# Patient Record
Sex: Female | Born: 2000
Health system: Southern US, Community
[De-identification: ages and names within clinical notes are randomized; demographics above are authoritative.]

## PROBLEM LIST (undated history)

## (undated) DIAGNOSIS — J302 Other seasonal allergic rhinitis: Secondary | ICD-10-CM

---
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
## 2020-01-27 ENCOUNTER — Emergency Department (HOSPITAL_COMMUNITY): Payer: BC Managed Care – PPO

## 2020-01-27 ENCOUNTER — Emergency Department (HOSPITAL_COMMUNITY)
Admission: EM | Admit: 2020-01-27 | Discharge: 2020-01-27 | Disposition: A | Discharge status: Home / Self Care | Payer: BC Managed Care – PPO | Attending: Emergency Medicine | Admitting: Emergency Medicine

## 2020-01-27 ENCOUNTER — Encounter (HOSPITAL_COMMUNITY): Payer: Self-pay

## 2020-01-27 ENCOUNTER — Other Ambulatory Visit: Payer: Self-pay

## 2020-01-27 DIAGNOSIS — R079 Chest pain, unspecified: Secondary | ICD-10-CM | POA: Diagnosis present

## 2020-01-27 DIAGNOSIS — U071 COVID-19: Secondary | ICD-10-CM | POA: Diagnosis not present

## 2020-01-27 LAB — CBC WITH DIFFERENTIAL/PLATELET
Abs Immature Granulocytes: 0.01 10*3/uL (ref 0.00–0.07)
Basophils Absolute: 0 10*3/uL (ref 0.0–0.1)
Basophils Relative: 1 %
Eosinophils Absolute: 0 10*3/uL (ref 0.0–0.5)
Eosinophils Relative: 1 %
HCT: 43 % (ref 36.0–46.0)
Hemoglobin: 13.8 g/dL (ref 12.0–15.0)
Immature Granulocytes: 0 %
Lymphocytes Relative: 39 %
Lymphs Abs: 1.6 10*3/uL (ref 0.7–4.0)
MCH: 30.7 pg (ref 26.0–34.0)
MCHC: 32.1 g/dL (ref 30.0–36.0)
MCV: 95.8 fL (ref 80.0–100.0)
Monocytes Absolute: 0.5 10*3/uL (ref 0.1–1.0)
Monocytes Relative: 11 %
Neutro Abs: 2 10*3/uL (ref 1.7–7.7)
Neutrophils Relative %: 48 %
Platelets: 240 10*3/uL (ref 150–400)
RBC: 4.49 MIL/uL (ref 3.87–5.11)
RDW: 12.5 % (ref 11.5–15.5)
WBC Morphology: ABNORMAL
WBC: 4.1 10*3/uL (ref 4.0–10.5)
nRBC: 0 % (ref 0.0–0.2)

## 2020-01-27 LAB — BASIC METABOLIC PANEL
Anion gap: 8 (ref 5–15)
BUN: 12 mg/dL (ref 6–20)
CO2: 26 mmol/L (ref 22–32)
Calcium: 9 mg/dL (ref 8.9–10.3)
Chloride: 103 mmol/L (ref 98–111)
Creatinine, Ser: 0.62 mg/dL (ref 0.44–1.00)
GFR calc Af Amer: >60 mL/min (ref >60)
GFR calc non Af Amer: >60 mL/min (ref >60)
Glucose, Bld: 108 mg/dL — ABNORMAL HIGH (ref 70–99)
Potassium: 3.6 mmol/L (ref 3.5–5.1)
Sodium: 137 mmol/L (ref 135–145)

## 2020-01-27 LAB — TROPONIN I (HIGH SENSITIVITY): Troponin I (High Sensitivity): <2 ng/L (ref <18)

## 2020-01-27 MED ORDER — ALBUTEROL SULFATE HFA 108 (90 BASE) MCG/ACT IN AERS
2.0000 | INHALATION_SPRAY | Freq: Once | RESPIRATORY_TRACT | Status: AC
  Administered 2020-01-27: 2 via RESPIRATORY_TRACT
  Filled 2020-01-27: qty 6.7

## 2020-01-27 MED ORDER — AEROCHAMBER Z-STAT PLUS/MEDIUM MISC
1.0000 | Freq: Once | Status: AC
  Administered 2020-01-27: 1
  Filled 2020-01-27: qty 1

## 2020-01-27 NOTE — ED Triage Notes (Signed)
Pt presents to ED with complaints of mid chest pain started today. Pt diagnosed with Covid on Monday.

## 2020-01-27 NOTE — Discharge Instructions (Addendum)
Continue your home isolation for a total of 10 days, rest and make sure you are drinking plenty of fluids. Ibuprofen (advil or motrin) can help with body aches and any fever if this develops.  You may use the inhaler given - 2 puffs every 4 hours if you continue to feel tight in your chest or you develop wheezing or shortness of breath.       Person Under Monitoring Name: Cynthia Joyce  Location: [address] [place] [address]   Infection Prevention Recommendations for Individuals Confirmed to have, or Being Evaluated for, 2019 Novel Coronavirus (COVID-19) Infection Who Receive Care at Home  Individuals who are confirmed to have, or are being evaluated for, COVID-19 should follow the prevention steps below until a healthcare provider or local or state health department says they can return to normal activities.  Stay home except to get medical care You should restrict activities outside your home, except for getting medical care. Do not go to work, school, or public areas, and do not use public transportation or taxis.  Call ahead before visiting your doctor Before your medical appointment, call the healthcare provider and tell them that you have, or are being evaluated for, COVID-19 infection. This will help the healthcare provider's office take steps to keep other people from getting infected. Ask your healthcare provider to call the local or state health department.  Monitor your symptoms Seek prompt medical attention if your illness is worsening (e.g., difficulty breathing). Before going to your medical appointment, call the healthcare provider and tell them that you have, or are being evaluated for, COVID-19 infection. Ask your healthcare provider to call the local or state health department.  Wear a facemask You should wear a facemask that covers your nose and mouth when you are in the same room with other people and when you visit a healthcare provider. People  who live with or visit you should also wear a facemask while they are in the same room with you.  Separate yourself from other people in your home As much as possible, you should stay in a different room from other people in your home. Also, you should use a separate bathroom, if available.  Avoid sharing household items You should not share dishes, drinking glasses, cups, eating utensils, towels, bedding, or other items with other people in your home. After using these items, you should wash them thoroughly with soap and water.  Cover your coughs and sneezes Cover your mouth and nose with a tissue when you cough or sneeze, or you can cough or sneeze into your sleeve. Throw used tissues in a lined trash can, and immediately wash your hands with soap and water for at least 20 seconds or use an alcohol-based hand rub.  Wash your [employer] your hands often and thoroughly with soap and water for at least 20 seconds. You can use an alcohol-based hand sanitizer if soap and water are not available and if your hands are not visibly dirty. Avoid touching your eyes, nose, and mouth with unwashed hands.   Prevention Steps for Caregivers and Household Members of Individuals Confirmed to have, or Being Evaluated for, COVID-19 Infection Being Cared for in the Home  If you live with, or provide care at home for, a person confirmed to have, or being evaluated for, COVID-19 infection please follow these guidelines to prevent infection:  Follow healthcare provider's instructions Make sure that you understand and can help the patient follow any healthcare provider instructions for  all care.  Provide for the patient's basic needs You should help the patient with basic needs in the home and provide support for getting groceries, prescriptions, and other personal needs.  Monitor the patient's symptoms If they are getting sicker, call his or her medical provider and tell them that the patient has, or  is being evaluated for, COVID-19 infection. This will help the healthcare provider's office take steps to keep other people from getting infected. Ask the healthcare provider to call the local or state health department.  Limit the number of people who have contact with the patient If possible, have only one caregiver for the patient. Other household members should stay in another home or place of residence. If this is not possible, they should stay in another room, or be separated from the patient as much as possible. Use a separate bathroom, if available. Restrict visitors who do not have an essential need to be in the home.  Keep older adults, very young children, and other sick people away from the patient Keep older adults, very young children, and those who have compromised immune systems or chronic health conditions away from the patient. This includes people with chronic heart, lung, or kidney conditions, diabetes, and cancer.  Ensure good ventilation Make sure that shared spaces in the home have good air flow, such as from an air conditioner or an opened window, weather permitting.  Wash your hands often Wash your hands often and thoroughly with soap and water for at least 20 seconds. You can use an alcohol based hand sanitizer if soap and water are not available and if your hands are not visibly dirty. Avoid touching your eyes, nose, and mouth with unwashed hands. Use disposable paper towels to dry your hands. If not available, use dedicated cloth towels and replace them when they become wet.  Wear a facemask and gloves Wear a disposable facemask at all times in the room and gloves when you touch or have contact with the patient's blood, body fluids, and/or secretions or excretions, such as sweat, saliva, sputum, nasal mucus, vomit, urine, or feces.  Ensure the mask fits over your nose and mouth tightly, and do not touch it during use. Throw out disposable facemasks and gloves after  using them. Do not reuse. Wash your hands immediately after removing your facemask and gloves. If your personal clothing becomes contaminated, carefully remove clothing and launder. Wash your hands after handling contaminated clothing. Place all used disposable facemasks, gloves, and other waste in a lined container before disposing them with other household waste. Remove gloves and wash your hands immediately after handling these items.  Do not share dishes, glasses, or other household items with the patient Avoid sharing household items. You should not share dishes, drinking glasses, cups, eating utensils, towels, bedding, or other items with a patient who is confirmed to have, or being evaluated for, COVID-19 infection. After the person uses these items, you should wash them thoroughly with soap and water.  Wash laundry thoroughly Immediately remove and wash clothes or bedding that have blood, body fluids, and/or secretions or excretions, such as sweat, saliva, sputum, nasal mucus, vomit, urine, or feces, on them. Wear gloves when handling laundry from the patient. Read and follow directions on labels of laundry or clothing items and detergent. In general, wash and dry with the warmest temperatures recommended on the label.  Clean all areas the individual has used often Clean all touchable surfaces, such as counters, tabletops, doorknobs, bathroom fixtures, toilets,  phones, keyboards, tablets, and bedside tables, every day. Also, clean any surfaces that may have blood, body fluids, and/or secretions or excretions on them. Wear gloves when cleaning surfaces the patient has come in contact with. Use a diluted bleach solution (e.g., dilute bleach with 1 part bleach and 10 parts water) or a household disinfectant with a label that says EPA-registered for coronaviruses. To make a bleach solution at home, add 1 tablespoon of bleach to 1 quart (4 cups) of water. For a larger supply, add  cup of bleach  to 1 gallon (16 cups) of water. Read labels of cleaning products and follow recommendations provided on product labels. Labels contain instructions for safe and effective use of the cleaning product including precautions you should take when applying the product, such as wearing gloves or eye protection and making sure you have good ventilation during use of the product. Remove gloves and wash hands immediately after cleaning.  Monitor yourself for signs and symptoms of illness Caregivers and household members are considered close contacts, should monitor their health, and will be asked to limit movement outside of the home to the extent possible. Follow the monitoring steps for close contacts listed on the symptom monitoring form.   ? If you have additional questions, contact your local health department or call the epidemiologist on call at [phone] (available 24/7). ? This guidance is subject to change. For the most up-to-date guidance from [employer], please refer to their website: [email or web address]

## 2020-01-29 NOTE — ED Provider Notes (Signed)
[facility] EMERGENCY DEPARTMENT Provider Note   CSN: [id number] Arrival date & time: 01/27/20  1524     History Chief Complaint  Patient presents with  . Chest Pain  . Covid Positive    Cynthia Joyce is a 19 y.o. female with no significant past medical history presenting for evaluation of midsternal chest pressure which started today.  She tested positive for Covid 2 days ago.  She denies having any symptoms the day of her testing, but states her sister tested positive so she got screened as well.  She does report a nonproductive cough, endorses some fatigue, denies shortness of breath, also no fevers, no abdominal pain, no nausea or vomiting, no recognized wheezing, also denies palpitations, lightheadedness or diaphoresis.  The history is provided by the patient.       Past Medical History:  Diagnosis Date  . Seasonal allergies     There are no problems to display for this patient.   History reviewed. No pertinent surgical history.   OB History   No obstetric history on file.     No family history on file.  Social History   Tobacco Use  . Smoking status: Never Smoker  . Smokeless tobacco: Never Used  Substance Use Topics  . Alcohol use: Never  . Drug use: Never    Home Medications Prior to Admission medications   Medication Sig Start Date End Date Taking? Authorizing Provider  acetaminophen (TYLENOL) 325 MG tablet Take 650 mg by mouth every 6 (six) hours as needed.   Yes [provider]  guaiFENesin (MUCINEX) 600 MG 12 hr tablet Take 1,200 mg by mouth 2 (two) times daily.   Yes [provider]  ibuprofen (ADVIL) 200 MG tablet Take 400 mg by mouth every 6 (six) hours as needed for headache or moderate pain.   Yes [provider]  LO LOESTRIN FE 1 MG-10 MCG / 10 MCG tablet Take 1 tablet by mouth daily. 02/26/18  Yes [provider]  FLUoxetine (PROZAC) 10 MG tablet Take 1 tablet (10 mg total) by mouth daily. Patient not  taking: Reported on 01/27/2020 03/02/18   Gerhard Munch, MD    Allergies    Patient has no known allergies.  Review of Systems   Review of Systems  Constitutional: Positive for fatigue. Negative for chills and fever.  HENT: Negative for congestion, ear pain, rhinorrhea, sinus pressure, sore throat, trouble swallowing and voice change.   Eyes: Negative for discharge.  Respiratory: Positive for cough and chest tightness. Negative for shortness of breath, wheezing and stridor.   Cardiovascular: Negative for chest pain, palpitations and leg swelling.  Gastrointestinal: Negative for abdominal pain.  Genitourinary: Negative.   Musculoskeletal: Positive for myalgias.  All other systems reviewed and are negative.   Physical Exam Updated Vital Signs BP 119/89   Pulse 79   Temp 98.2 F (36.8 C) (Oral)   Resp 17   Ht 5\' 1"  (1.549 m)   Wt 46.3 kg   LMP 01/25/2020   SpO2 100%   BMI 19.27 kg/m   Physical Exam Vitals and nursing note reviewed.  Constitutional:      Appearance: She is well-developed.  HENT:     Head: Normocephalic and atraumatic.  Eyes:     Conjunctiva/sclera: Conjunctivae normal.  Cardiovascular:     Rate and Rhythm: Normal rate and regular rhythm.     Heart sounds: Normal heart sounds.  Pulmonary:     Effort: Pulmonary effort is normal.  Breath sounds: Normal breath sounds. No decreased breath sounds, wheezing, rhonchi or rales.  Abdominal:     General: Bowel sounds are normal.     Palpations: Abdomen is soft.     Tenderness: There is no abdominal tenderness.  Musculoskeletal:        General: Normal range of motion.     Cervical back: Normal range of motion.  Skin:    General: Skin is warm and dry.  Neurological:     Mental Status: She is alert.     ED Results / Procedures / Treatments   Labs (all labs ordered are listed, but only abnormal results are displayed) Labs Reviewed  BASIC METABOLIC PANEL - Abnormal; Notable for the following  components:      Result Value   Glucose, Bld 108 (*)    All other components within normal limits  CBC WITH DIFFERENTIAL/PLATELET  TROPONIN I (HIGH SENSITIVITY)    EKG EKG Interpretation  Date/Time:  Wednesday January 27 2020 15:29:23 EDT Ventricular Rate:  119 PR Interval:    QRS Duration: 72 QT Interval:  424 QTC Calculation: 596 R Axis:   92 Text Interpretation: Accelerated Junctional rhythm Rightward axis Nonspecific T wave abnormality Prolonged QT Abnormal ECG No old tracing to compare Confirmed by Mancel Bale [phone]) on 01/27/2020 5:47:38 PM   Radiology No results found. Results for orders placed or performed during the hospital encounter of 01/27/20  Basic metabolic panel  Result Value Ref Range   Sodium 137 135 - 145 mmol/L   Potassium 3.6 3.5 - 5.1 mmol/L   Chloride 103 98 - 111 mmol/L   CO2 26 22 - 32 mmol/L   Glucose, Bld 108 (H) 70 - 99 mg/dL   BUN 12 6 - 20 mg/dL   Creatinine, Ser 5.63 0.44 - 1.00 mg/dL   Calcium 9.0 8.9 - 87.5 mg/dL   GFR calc non Af Amer >60 >60 mL/min   GFR calc Af Amer >60 >60 mL/min   Anion gap 8 5 - 15  CBC with Differential  Result Value Ref Range   WBC 4.1 4.0 - 10.5 K/uL   RBC 4.49 3.87 - 5.11 MIL/uL   Hemoglobin 13.8 12.0 - 15.0 g/dL   HCT 64.3 36 - 46 %   MCV 95.8 80.0 - 100.0 fL   MCH 30.7 26.0 - 34.0 pg   MCHC 32.1 30.0 - 36.0 g/dL   RDW 32.9 51.8 - 84.1 %   Platelets 240 150 - 400 K/uL   nRBC 0.0 0.0 - 0.2 %   Neutrophils Relative % 48 %   Neutro Abs 2.0 1.7 - 7.7 K/uL   Lymphocytes Relative 39 %   Lymphs Abs 1.6 0.7 - 4.0 K/uL   Monocytes Relative 11 %   Monocytes Absolute 0.5 0 - 1 K/uL   Eosinophils Relative 1 %   Eosinophils Absolute 0.0 0 - 0 K/uL   Basophils Relative 1 %   Basophils Absolute 0.0 0 - 0 K/uL   WBC Morphology Abnormal lymphocytes present    Immature Granulocytes 0 %   Abs Immature Granulocytes 0.01 0.00 - 0.07 K/uL  Troponin I (High Sensitivity)  Result Value Ref Range   Troponin I  (High Sensitivity) <2 <18 ng/L   DG Chest Portable 1 View  Result Date: 01/27/2020 CLINICAL DATA:  Mid chest pain EXAM: PORTABLE CHEST 1 VIEW COMPARISON:  None. FINDINGS: Heart and mediastinal contours are within normal limits. No focal opacities or effusions. No acute bony abnormality. Mild leftward  scoliosis centered in the upper to midthoracic spine. IMPRESSION: No active disease. Electronically Signed   By: Charlett Nose M.D.   On: 01/27/2020 16:37    Procedures Procedures (including critical care time)  Medications Ordered in ED Medications  albuterol (VENTOLIN HFA) 108 (90 Base) MCG/ACT inhaler 2 puff (2 puffs Inhalation Given 01/27/20 1919)  aerochamber Z-Stat Plus/medium 1 each (1 each Other Given 01/27/20 1919)    ED Course  I have reviewed the triage vital signs and the nursing notes.  Pertinent labs & imaging results that were available during my care of the patient were reviewed by me and considered in my medical decision making (see chart for details).    MDM Rules/Calculators/A&P                          Labs and imaging were reviewed and discussed with patient.  Her exam and history are reassuring.  She she was given an albuterol MDI dosing and had some improvement in her symptoms, still no wheezing, good aeration.  She was encouraged to continue her home isolation, Advil or Motrin for body aches.  Encouraged increased fluids.  Recheck for any persistent or worsening symptoms.  Cynthia Joyce was evaluated in Emergency Department on 01/29/2020 for the symptoms described in the history of present illness. She was evaluated in the context of the global COVID-19 pandemic, which necessitated consideration that the patient might be at risk for infection with the SARS-CoV-2 virus that causes COVID-19. Institutional protocols and algorithms that pertain to the evaluation of patients at risk for COVID-19 are in a state of rapid change based on information released by regulatory bodies  including the CDC and federal and state organizations. These policies and algorithms were followed during the patient's care in the ED.  Final Clinical Impression(s) / ED Diagnoses Final diagnoses:  COVID-19    Rx / DC Orders ED Discharge Orders    None       Victoriano Lain 01/29/20 1847    Mancel Bale, MD 01/31/20 [phone]

## 2020-08-15 ENCOUNTER — Other Ambulatory Visit: Payer: Self-pay

## 2020-08-15 ENCOUNTER — Ambulatory Visit
Admission: RE | Admit: 2020-08-15 | Discharge: 2020-08-15 | Disposition: A | Discharge status: Home / Self Care | Payer: BC Managed Care – PPO | Source: Ambulatory Visit | Attending: Internal Medicine | Admitting: Internal Medicine

## 2020-08-15 VITALS — BP 121/76 | HR 125 | Temp 98.1°F | Resp 18

## 2020-08-15 DIAGNOSIS — N3001 Acute cystitis with hematuria: Secondary | ICD-10-CM | POA: Diagnosis not present

## 2020-08-15 LAB — POCT URINALYSIS DIP (MANUAL ENTRY)
Bilirubin, UA: NEGATIVE
Glucose, UA: NEGATIVE mg/dL
Ketones, POC UA: NEGATIVE mg/dL
Leukocytes, UA: NEGATIVE
Nitrite, UA: POSITIVE — AB
Protein Ur, POC: 100 mg/dL — AB
Spec Grav, UA: >=1.03 — AB (ref 1.010–1.025)
Urobilinogen, UA: 0.2 E.U./dL
pH, UA: 6 (ref 5.0–8.0)

## 2020-08-15 MED ORDER — NITROFURANTOIN MONOHYD MACRO 100 MG PO CAPS: 100.0000 mg | ORAL_CAPSULE | Freq: Two times a day (BID) | ORAL | 0 refills | Status: DC

## 2020-08-15 NOTE — ED Provider Notes (Signed)
[facility]    CSN: [id number] Arrival date & time: 08/15/20  4163      History   Chief Complaint No chief complaint on file.   HPI Cynthia Joyce is a 20 y.o. female who presents with onset of dysuria, frequency since yesterday. Denies abnormal vaginal discharge, fever or flank pain. LMP ended yesterday. Last UTI > 1y ago.   Past Medical History:  Diagnosis Date  . Seasonal allergies     There are no problems to display for this patient.   History reviewed. No pertinent surgical history.  OB History   No obstetric history on file.      Home Medications    Prior to Admission medications   Medication Sig Start Date End Date Taking? Authorizing Provider  nitrofurantoin, macrocrystal-monohydrate, (MACROBID) 100 MG capsule Take 1 capsule (100 mg total) by mouth 2 (two) times daily. 08/15/20  Yes Rodriguez-Southworth, Nettie Elm, PA-C  acetaminophen (TYLENOL) 325 MG tablet Take 650 mg by mouth every 6 (six) hours as needed.    [provider]  ibuprofen (ADVIL) 200 MG tablet Take 400 mg by mouth every 6 (six) hours as needed for headache or moderate pain.    [provider]  LO LOESTRIN FE 1 MG-10 MCG / 10 MCG tablet Take 1 tablet by mouth daily. 02/26/18   [provider]  FLUoxetine (PROZAC) 10 MG tablet Take 1 tablet (10 mg total) by mouth daily. Patient not taking: Reported on 01/27/2020 03/02/18 08/15/20  Gerhard Munch, MD    Family History History reviewed. No pertinent family history.  Social History Social History   Tobacco Use  . Smoking status: Never Smoker  . Smokeless tobacco: Never Used  Substance Use Topics  . Alcohol use: Never  . Drug use: Never     Allergies   Patient has no known allergies.   Review of Systems Review of Systems  Constitutional: Negative for fever.  Gastrointestinal: Negative for abdominal pain.  Genitourinary: Positive for dysuria and frequency. Negative for flank pain, menstrual  problem, pelvic pain and vaginal discharge.  Musculoskeletal: Negative for back pain and myalgias.     Physical Exam Triage Vital Signs ED Triage Vitals  Enc Vitals Group     BP 08/15/20 0959 121/76     Pulse Rate 08/15/20 0959 (!) 125     Resp 08/15/20 0959 18     Temp 08/15/20 0959 98.1 F (36.7 C)     Temp Source 08/15/20 0959 Oral     SpO2 08/15/20 0959 97 %     Weight --      Height --      Head Circumference --      Peak Flow --      Pain Score 08/15/20 1000 0     Pain Loc --      Pain Edu? --      Excl. in GC? --    No data found.  Updated Vital Signs BP 121/76 (BP Location: Right Arm)   Pulse (!) 125   Temp 98.1 F (36.7 C) (Oral)   Resp 18   LMP 08/11/2020   SpO2 97%   Visual Acuity Right Eye Distance:   Left Eye Distance:   Bilateral Distance:    Right Eye Near:   Left Eye Near:    Bilateral Near:     Physical Exam Physical Exam Vitals and nursing note reviewed.  Constitutional:      General: She is not in acute distress.  Appearance: She is not toxic-appearing.  HENT:     Head: Normocephalic.     Right Ear: External ear normal.     Left Ear: External ear normal.  Eyes:     General: No scleral icterus.    Conjunctiva/sclera: Conjunctivae normal.  Pulmonary:     Effort: Pulmonary effort is normal.  Abdominal:     General: Bowel sounds are normal.     Palpations: Abdomen is soft. There is no mass.     Tenderness: There is no guarding or rebound.     Comments: - CVA tenderness   Musculoskeletal:        General: Normal range of motion.     Cervical back: Neck supple.   Skin:    General: Skin is warm and dry.     Findings: No rash.  Neurological:     Mental Status: She is alert and oriented to person, place, and time.     Gait: Gait normal.  Psychiatric:        Mood and Affect: Mood normal.        Behavior: Behavior normal.        Thought Content: Thought content normal.        Judgment: Judgment normal.     UC Treatments /  Results  Labs (all labs ordered are listed, but only abnormal results are displayed) Labs Reviewed  URINE CULTURE - Abnormal; Notable for the following components:      Result Value   Culture 10,000 COLONIES/mL ESCHERICHIA COLI (*)    Organism ID, Bacteria ESCHERICHIA COLI (*)    All other components within normal limits  POCT URINALYSIS DIP (MANUAL ENTRY) - Abnormal; Notable for the following components:   Color, UA straw (*)    Clarity, UA cloudy (*)    Spec Grav, UA >=1.030 (*)    Blood, UA large (*)    Protein Ur, POC =100 (*)    Nitrite, UA Positive (*)    All other components within normal limits    EKG   Radiology No results found.  Procedures Procedures (including critical care time)  Medications Ordered in UC Medications - No data to display  Initial Impression / Assessment and Plan / UC Course  I have reviewed the triage vital signs and the nursing notes. Pertinent labs  results that were available during my care of the patient were reviewed by me and considered in my medical decision making (see chart for details). Has UTI. Urine culture was sent out.  I placed her on Macrobid. We will inform her if the culture shows we need to change her medication.    Final Clinical Impressions(s) / UC Diagnoses   Final diagnoses:  Acute cystitis with hematuria   Discharge Instructions   None    ED Prescriptions    Medication Sig Dispense Auth. Provider   nitrofurantoin, macrocrystal-monohydrate, (MACROBID) 100 MG capsule Take 1 capsule (100 mg total) by mouth 2 (two) times daily. 10 capsule Rodriguez-Southworth, Nettie Elm, PA-C     PDMP not reviewed this encounter.   Garey Ham, [place] 08/19/20 [phone]

## 2020-08-15 NOTE — ED Triage Notes (Signed)
Urinary frequency, pain on urination since yesterday.

## 2020-08-17 LAB — URINE CULTURE

## 2020-08-18 LAB — URINE CULTURE: Culture: 10000 — AB

## 2020-09-14 ENCOUNTER — Encounter: Payer: Self-pay | Admitting: Physician Assistant

## 2020-09-14 ENCOUNTER — Other Ambulatory Visit: Payer: Self-pay

## 2020-09-14 ENCOUNTER — Ambulatory Visit (INDEPENDENT_AMBULATORY_CARE_PROVIDER_SITE_OTHER): Payer: BC Managed Care – PPO | Admitting: Physician Assistant

## 2020-09-14 DIAGNOSIS — L732 Hidradenitis suppurativa: Secondary | ICD-10-CM

## 2020-09-14 MED ORDER — DOXYCYCLINE HYCLATE 100 MG PO TABS: 100.0000 mg | ORAL_TABLET | Freq: Two times a day (BID) | ORAL | 1 refills | Status: DC

## 2020-09-14 NOTE — Progress Notes (Signed)
   New Patient   Subjective  Cynthia Joyce is a 20 y.o. female who presents for the following: New Patient (Initial Visit) (Patient here today for HS under both axilla x years. Per patient last week she started flaring up, no previous treatment.). Cynthia Joyce is a 20 y.o. female who presents for the following: New Patient (Initial Visit) (Patient here today for HS under both axilla x years. Per patient last week she started flaring up, no previous treatment.).One on the right side was much larger but it has decreased in size. Left axillae- persistent x 3 weeks. Tender and pink.  Patient is on oral contraceptive and menses have been regular and normal. She is within normal weight range. She is self conscious about this condition and is worried about it getting worse.    The following portions of the chart were reviewed this encounter and updated as appropriate:  Tobacco  Allergies  Meds  Problems  Med Hx  Surg Hx  Fam Hx      Objective  Well appearing patient in no apparent distress; mood and affect are within normal limits.  A focused examination was performed including both axillae. Relevant physical exam findings are noted in the Assessment and Plan.  Objective  Left Axilla, Right Axilla: Pink cyst. No drainage or tunneling. No signs of hirsutism including obesity, facial hair   Assessment & Plan  Hidradenitis suppurativa (2) Left Axilla; Right Axilla  doxycycline (VIBRA-TABS) 100 MG tablet - Left Axilla, Right Axilla  Cysts were not large enough to perform surgery today. We will revisit after she completes her round of antibiotics.  I, Marcie Shearon, PA-C, have reviewed all documentation for this visit. The documentation on 09/14/20 for the exam, diagnosis, procedures, and orders are all accurate and complete.

## 2020-09-14 NOTE — Progress Notes (Deleted)
   New Patient   Subjective  Cynthia Joyce is a 20 y.o. female who presents for the following: New Patient (Initial Visit) (Patient here today for HS under both axilla x years. Per patient last week she started flaring up, no previous treatment.).One on the right side was much larger but it has decreased in size. Left axillae- persistent x 3 weeks. Tender and pink. No drainage. Patient is on oral contraceptive and menses have been regular with normal bleeding.     The following portions of the chart were reviewed this encounter and updated as appropriate:      Objective  Well appearing patient in no apparent distress; mood and affect are within normal limits.  A focused examination was performed including both axillae. Relevant physical exam findings are noted in the Assessment and Plan.  Objective  Left Axilla, Right Axilla: Pink cyst.    Assessment & Plan  Hidradenitis suppurativa (2) Left Axilla; Right Axilla  doxycycline (VIBRA-TABS) 100 MG tablet - Left Axilla, Right Axilla    I, Jakin Pavao, PA-C, have reviewed all documentation for this visit. The documentation on 09/14/20 for the exam, diagnosis, procedures, and orders are all accurate and complete.

## 2020-12-06 ENCOUNTER — Ambulatory Visit
Admission: RE | Admit: 2020-12-06 | Discharge: 2020-12-06 | Disposition: A | Discharge status: Home / Self Care | Payer: BC Managed Care – PPO | Source: Ambulatory Visit | Attending: Family Medicine | Admitting: Family Medicine

## 2020-12-06 ENCOUNTER — Other Ambulatory Visit: Payer: Self-pay

## 2020-12-06 VITALS — BP 124/87 | HR 108 | Temp 98.5°F | Resp 17

## 2020-12-06 DIAGNOSIS — J039 Acute tonsillitis, unspecified: Secondary | ICD-10-CM

## 2020-12-06 DIAGNOSIS — J029 Acute pharyngitis, unspecified: Secondary | ICD-10-CM | POA: Diagnosis not present

## 2020-12-06 LAB — POCT RAPID STREP A (OFFICE): Rapid Strep A Screen: NEGATIVE

## 2020-12-06 MED ORDER — AMOXICILLIN 875 MG PO TABS: 875.0000 mg | ORAL_TABLET | Freq: Two times a day (BID) | ORAL | 0 refills | Status: DC

## 2020-12-06 NOTE — Discharge Instructions (Addendum)
Gargle with warm salt water to help alleviate swelling and irritation. Complete entire course of medication.  Take ibuprofen or Tylenol for pain as needed.

## 2020-12-06 NOTE — ED Provider Notes (Signed)
[facility]    CSN: [id number] Arrival date & time: 12/06/20  0849      History   Chief Complaint Chief Complaint  Patient presents with   Sore Throat    HPI Cynthia Joyce is a 20 y.o. female.   HPI Patient here with sore throat and pain with swallowing which developed yesterday.  Upon awakening today her symptoms are worse.  She reports a history of strep.  She has not had fever.  She is not experiencing any cough, shortness of breath or nasal symptoms.  No known sick exposure.  Past Medical History:  Diagnosis Date   Seasonal allergies     There are no problems to display for this patient.   History reviewed. No pertinent surgical history.  OB History   No obstetric history on file.      Home Medications    Prior to Admission medications   Medication Sig Start Date End Date Taking? Authorizing Provider  amoxicillin (AMOXIL) 875 MG tablet Take 1 tablet (875 mg total) by mouth 2 (two) times daily. 12/06/20  Yes Bing Neighbors, FNP  acetaminophen (TYLENOL) 325 MG tablet Take 650 mg by mouth every 6 (six) hours as needed.    [provider]  BLISOVI FE 1/20 1-20 MG-MCG tablet Take 1 tablet by mouth daily. 09/07/20   [provider]  EYE ALLERGY ITCH RELIEF 0.2 % SOLN INSTILL 1 DROP IN BOTH EYES EVERY DAY 08/11/20   [provider]  ibuprofen (ADVIL) 200 MG tablet Take 400 mg by mouth every 6 (six) hours as needed for headache or moderate pain.    [provider]  FLUoxetine (PROZAC) 10 MG tablet Take 1 tablet (10 mg total) by mouth daily. Patient not taking: Reported on 01/27/2020 03/02/18 08/15/20  Gerhard Munch, MD    Family History No family history on file.  Social History Social History   Tobacco Use   Smoking status: Never   Smokeless tobacco: Never  Substance Use Topics   Alcohol use: Never   Drug use: Never     Allergies   Patient has no known allergies.   Review of Systems Review of  Systems Pertinent negatives listed in HPI  Physical Exam Triage Vital Signs ED Triage Vitals  Enc Vitals Group     BP 12/06/20 0927 124/87     Pulse Rate 12/06/20 0927 (!) 108     Resp 12/06/20 0927 17     Temp 12/06/20 0927 98.5 F (36.9 C)     Temp Source 12/06/20 0927 Oral     SpO2 12/06/20 0927 97 %     Weight --      Height --      Head Circumference --      Peak Flow --      Pain Score 12/06/20 0928 4     Pain Loc --      Pain Edu? --      Excl. in GC? --    No data found.  Updated Vital Signs BP 124/87 (BP Location: Right Arm)   Pulse (!) 108   Temp 98.5 F (36.9 C) (Oral)   Resp 17   SpO2 97%   Visual Acuity Right Eye Distance:   Left Eye Distance:   Bilateral Distance:    Right Eye Near:   Left Eye Near:    Bilateral Near:     Physical Exam General Appearance:    Alert, cooperative, no distress  HENT: Normocephalic, bilateral ears  normal, neck has bilateral anterior cervical nodes enlarged and tonsils red, enlarged, with exudate present  Eyes:    PERRL, conjunctiva/corneas clear, EOM's intact       Lungs:     Clear to auscultation bilaterally, respirations unlabored  Heart:    Regular rate and rhythm  Neurologic:   Awake, alert, oriented x 3. No apparent focal neurological deficit.         UC Treatments / Results  Labs (all labs ordered are listed, but only abnormal results are displayed) Labs Reviewed  POCT RAPID STREP A (OFFICE)    EKG   Radiology No results found.  Procedures Procedures (including critical care time)  Medications Ordered in UC Medications - No data to display  Initial Impression / Assessment and Plan / UC Course  I have reviewed the triage vital signs and the nursing notes.  Pertinent labs & imaging results that were available during my care of the patient were reviewed by me and considered in my medical decision making (see chart for details).     Tonsillitis and acute pharyngitis Treatment per discharge  instructions. Manage inflammation and irritation with salt warm water gargles Complete entire course of medication.  Follow-up with PCP if symptoms worsen or do not improve. Final Clinical Impressions(s) / UC Diagnoses   Final diagnoses:  Tonsillitis  Acute pharyngitis, unspecified etiology     Discharge Instructions      Gargle with warm salt water to help alleviate swelling and irritation. Complete entire course of medication.  Take ibuprofen or Tylenol for pain as needed.   ED Prescriptions     Medication Sig Dispense Auth. Provider   amoxicillin (AMOXIL) 875 MG tablet Take 1 tablet (875 mg total) by mouth 2 (two) times daily. 20 tablet Bing Neighbors, FNP      PDMP not reviewed this encounter.   Bing Neighbors, FNP 12/06/20 1154

## 2020-12-06 NOTE — ED Triage Notes (Signed)
Sore throat since yesterday.  Reports hx of strep throat

## 2020-12-14 ENCOUNTER — Encounter (HOSPITAL_COMMUNITY): Payer: Self-pay

## 2020-12-14 ENCOUNTER — Ambulatory Visit (HOSPITAL_COMMUNITY)
Admission: RE | Admit: 2020-12-14 | Discharge: 2020-12-14 | Disposition: A | Discharge status: Home / Self Care | Payer: BC Managed Care – PPO | Source: Ambulatory Visit | Attending: Student | Admitting: Student

## 2020-12-14 ENCOUNTER — Other Ambulatory Visit: Payer: Self-pay

## 2020-12-14 VITALS — BP 120/80 | HR 106 | Temp 98.2°F | Resp 18

## 2020-12-14 DIAGNOSIS — T7840XA Allergy, unspecified, initial encounter: Secondary | ICD-10-CM

## 2020-12-14 LAB — POCT INFECTIOUS MONO SCREEN, ED / UC: Mono Screen: NEGATIVE

## 2020-12-14 MED ORDER — PREDNISONE 20 MG PO TABS: 40.0000 mg | ORAL_TABLET | Freq: Every day | ORAL | 0 refills | Status: AC

## 2020-12-14 NOTE — ED Triage Notes (Signed)
Pt presents with itchy splotches all over body since waking up this morning.

## 2020-12-14 NOTE — ED Provider Notes (Signed)
[facility]    CSN: [id number] Arrival date & time: 12/14/20  1254      History   Chief Complaint Chief Complaint  Patient presents with   APPOINTMENT: Allergic Reaction    HPI Cynthia Joyce is a 20 y.o. female presenting with red rash following amoxicillin that was prescribed for "tonsillitis". Medical history seasonal allergies. She has completed 8 days of the amoxicillin. Woke up with itchy red rash 1 day ago. Benedryl providing minimal relief. Denies shortness of breath, facial swelling, dizziness, nausea. Sore throat has resolved. No abd pain.  HPI  Past Medical History:  Diagnosis Date   Seasonal allergies     There are no problems to display for this patient.   History reviewed. No pertinent surgical history.  OB History   No obstetric history on file.      Home Medications    Prior to Admission medications   Medication Sig Start Date End Date Taking? Authorizing Provider  predniSONE (DELTASONE) 20 MG tablet Take 2 tablets (40 mg total) by mouth daily for 5 days. 12/14/20 12/19/20 Yes Rhys Martini, PA-C  acetaminophen (TYLENOL) 325 MG tablet Take 650 mg by mouth every 6 (six) hours as needed.    [provider]  amoxicillin (AMOXIL) 875 MG tablet Take 1 tablet (875 mg total) by mouth 2 (two) times daily. 12/06/20   Bing Neighbors, FNP  BLISOVI FE 1/20 1-20 MG-MCG tablet Take 1 tablet by mouth daily. 09/07/20   [provider]  EYE ALLERGY ITCH RELIEF 0.2 % SOLN INSTILL 1 DROP IN BOTH EYES EVERY DAY 08/11/20   [provider]  ibuprofen (ADVIL) 200 MG tablet Take 400 mg by mouth every 6 (six) hours as needed for headache or moderate pain.    [provider]  FLUoxetine (PROZAC) 10 MG tablet Take 1 tablet (10 mg total) by mouth daily. Patient not taking: Reported on 01/27/2020 03/02/18 08/15/20  Gerhard Munch, MD    Family History History reviewed. No pertinent family history.  Social History Social History    Tobacco Use   Smoking status: Never   Smokeless tobacco: Never  Substance Use Topics   Alcohol use: Never   Drug use: Never     Allergies   Patient has no known allergies.   Review of Systems Review of Systems  Skin:  Positive for rash.  All other systems reviewed and are negative.   Physical Exam Triage Vital Signs ED Triage Vitals  Enc Vitals Group     BP      Pulse      Resp      Temp      Temp src      SpO2      Weight      Height      Head Circumference      Peak Flow      Pain Score      Pain Loc      Pain Edu?      Excl. in GC?    No data found.  Updated Vital Signs BP 120/80 (BP Location: Right Arm)   Pulse (!) 106   Temp 98.2 F (36.8 C) (Oral)   Resp 18   SpO2 99%   Visual Acuity Right Eye Distance:   Left Eye Distance:   Bilateral Distance:    Right Eye Near:   Left Eye Near:    Bilateral Near:     Physical Exam Vitals reviewed.  Constitutional:  General: She is not in acute distress.    Appearance: Normal appearance. She is not ill-appearing.  HENT:     Head: Normocephalic and atraumatic.     Right Ear: Tympanic membrane, ear canal and external ear normal. No tenderness. No middle ear effusion. There is no impacted cerumen. Tympanic membrane is not perforated, erythematous, retracted or bulging.     Left Ear: Tympanic membrane, ear canal and external ear normal. No tenderness.  No middle ear effusion. There is no impacted cerumen. Tympanic membrane is not perforated, erythematous, retracted or bulging.     Nose: Nose normal. No congestion.     Mouth/Throat:     Mouth: Mucous membranes are moist.     Pharynx: Uvula midline. No oropharyngeal exudate or posterior oropharyngeal erythema.  Eyes:     Extraocular Movements: Extraocular movements intact.     Pupils: Pupils are equal, round, and reactive to light.  Cardiovascular:     Rate and Rhythm: Normal rate and regular rhythm.     Heart sounds: Normal heart sounds.   Pulmonary:     Effort: Pulmonary effort is normal.     Breath sounds: Normal breath sounds. No decreased breath sounds, wheezing, rhonchi or rales.  Abdominal:     Palpations: Abdomen is soft.     Tenderness: There is no abdominal tenderness. There is no guarding or rebound.  Skin:    Comments: See image below (R thigh) Diffuse maculopapular rash, worst on LEs. Minimal facial rash. No facial/pharyngeal swelling.  Neurological:     General: No focal deficit present.     Mental Status: She is alert and oriented to person, place, and time.  Psychiatric:        Mood and Affect: Mood normal.        Behavior: Behavior normal.        Thought Content: Thought content normal.        Judgment: Judgment normal.       UC Treatments / Results  Labs (all labs ordered are listed, but only abnormal results are displayed) Labs Reviewed  POCT INFECTIOUS MONO SCREEN, ED / UC    EKG   Radiology No results found.  Procedures Procedures (including critical care time)  Medications Ordered in UC Medications - No data to display  Initial Impression / Assessment and Plan / UC Course  I have reviewed the triage vital signs and the nursing notes.  Pertinent labs & imaging results that were available during my care of the patient were reviewed by me and considered in my medical decision making (see chart for details).  Clinical Course as of 12/14/20 1406  Wed Dec 14, 2020  1359 Mono Screen: NEGATIVE [LG]    Clinical Course User Index [LG] Rhys Martini, PA-C    This patient is a very pleasant 20 y.o. year old female presenting with rash following amoxicillin. Tachycardic but afebrile.  Rapid strep negative 12/06/20, culture was not sent. Rapid mono negative today.  Ddx is mono/reaction to amoxicillin, versus true allergic reaction. Sore throat has resolved and she has completed 8 days of amoxicillin, stop amoxicillin. Prednisone, benedryl as below.  ED return precautions discussed.  Patient verbalizes understanding and agreement.   Coding Level 4 for acute illness with systemic symptoms, and prescription drug management   Final Clinical Impressions(s) / UC Diagnoses   Final diagnoses:  Allergic reaction, initial encounter     Discharge Instructions      -Stop amoxicillin -Start prednisone: -Prednisone, 2 pills taken at  the same time for 5 days in a row.  Try taking this earlier in the day as it can give you energy. Avoid NSAIDs like ibuprofen and alleve while taking this medication as they can increase your risk of stomach upset and even GI bleeding when in combination with a steroid. You can continue tylenol (acetaminophen) up to 1000mg  3x daily. -Continue benedryl for symptomatic relief -Seek additional medical attention if symptoms getting worse instead of better, like new shortness of breath, facial swelling. -You can explore allergy testing with your primary care to confirm diagnosis.     ED Prescriptions     Medication Sig Dispense Auth. Provider   predniSONE (DELTASONE) 20 MG tablet Take 2 tablets (40 mg total) by mouth daily for 5 days. 10 tablet , PA-C      PDMP not reviewed this encounter.   Rhys Martini, PA-C 12/14/20 1407

## 2020-12-14 NOTE — Discharge Instructions (Addendum)
-  Stop amoxicillin -Start prednisone: -Prednisone, 2 pills taken at the same time for 5 days in a row.  Try taking this earlier in the day as it can give you energy. Avoid NSAIDs like ibuprofen and alleve while taking this medication as they can increase your risk of stomach upset and even GI bleeding when in combination with a steroid. You can continue tylenol (acetaminophen) up to 1000mg  3x daily. -Continue benedryl for symptomatic relief -Seek additional medical attention if symptoms getting worse instead of better, like new shortness of breath, facial swelling. -You can explore allergy testing with your primary care to confirm diagnosis.

## 2021-02-14 IMAGING — DX DG CHEST 1V PORT
1 series · 1 of 1 positions shown · non-contrast
Comparison: None.

CLINICAL DATA: Mid chest pain

EXAM:
PORTABLE CHEST 1 VIEW

[chest ap]
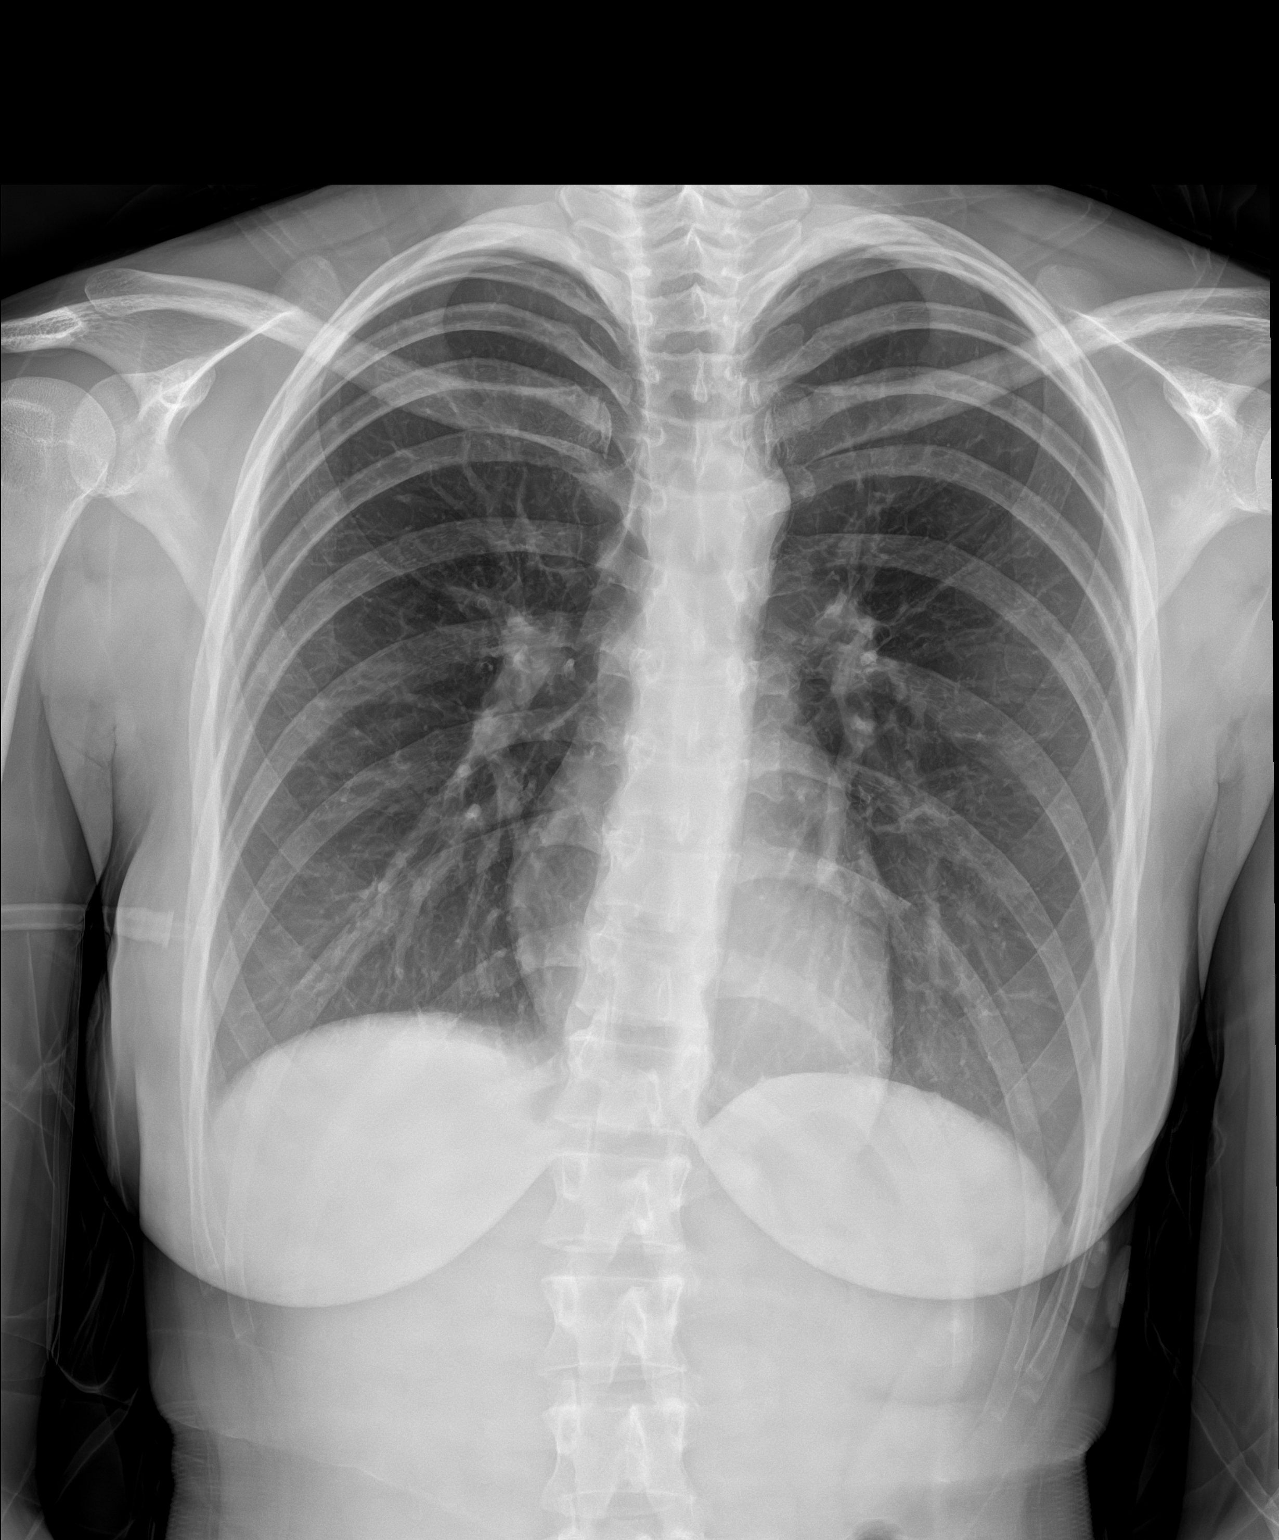

[1 of 1 positions shown; findings below may reference images not displayed]

FINDINGS: Heart and mediastinal contours are within normal limits. No focal
opacities or effusions. No acute bony abnormality. Mild leftward
scoliosis centered in the upper to midthoracic spine.
IMPRESSION: No active disease.

## 2021-03-27 ENCOUNTER — Other Ambulatory Visit: Payer: Self-pay

## 2021-03-27 ENCOUNTER — Ambulatory Visit (INDEPENDENT_AMBULATORY_CARE_PROVIDER_SITE_OTHER): Payer: BC Managed Care – PPO | Admitting: Podiatry

## 2021-03-27 DIAGNOSIS — L600 Ingrowing nail: Secondary | ICD-10-CM | POA: Diagnosis not present

## 2021-03-27 MED ORDER — GENTAMICIN SULFATE 0.1 % EX CREA: 1.0000 "application " | TOPICAL_CREAM | Freq: Two times a day (BID) | CUTANEOUS | 1 refills | Status: DC

## 2021-03-27 MED ORDER — DOXYCYCLINE HYCLATE 100 MG PO TABS: 100.0000 mg | ORAL_TABLET | Freq: Two times a day (BID) | ORAL | 0 refills | Status: DC

## 2021-03-27 NOTE — Patient Instructions (Signed)

## 2021-04-03 NOTE — Progress Notes (Signed)
   Subjective: Patient presents today for evaluation of pain to the medial aspect of the right great toe. Patient is concerned for possible ingrown nail.  It is very sensitive to touch.  Patient presents today for further treatment and evaluation.  Past Medical History:  Diagnosis Date   Seasonal allergies     Objective:  General: Well developed, nourished, in no acute distress, alert and oriented x3   Dermatology: Skin is warm, dry and supple bilateral.  Medial aspect right great toe appears to be erythematous with evidence of an ingrowing nail. Pain on palpation noted to the border of the nail fold. The remaining nails appear unremarkable at this time. There are no open sores, lesions.  Vascular: Dorsalis Pedis artery and Posterior Tibial artery pedal pulses palpable. No lower extremity edema noted.   Neruologic: Grossly intact via light touch bilateral.  Musculoskeletal: Muscular strength within normal limits in all groups bilateral. Normal range of motion noted to all pedal and ankle joints.   Assesement: #1 Paronychia with ingrowing nail medial aspect right great toe #2 Pain in toe  Plan of Care:  1. Patient evaluated.  2. Discussed treatment alternatives and plan of care. Explained nail avulsion procedure and post procedure course to patient. 3. Patient opted for permanent partial nail avulsion of the ingrown portion of the nail.  4. Prior to procedure, local anesthesia infiltration utilized using 3 ml of a 50:50 mixture of 2% plain lidocaine and 0.5% plain marcaine in a normal hallux block fashion and a betadine prep performed.  5. Partial permanent nail avulsion with chemical matrixectomy performed using 3x30sec applications of phenol followed by alcohol flush.  6. Light dressing applied.  Post care instructions provided 7.  Prescription for gentamicin 2% cream  8.  Prescription for doxycycline 100 mg 2 times daily #20  9.  Return to clinic 2 weeks.  Felecia Shelling,  DPM [facility]  Dr. Felecia Shelling, DPM    [address] [place], [place] [address]                Office [phone]  Fax [phone]

## 2021-04-10 ENCOUNTER — Ambulatory Visit: Payer: BC Managed Care – PPO | Admitting: Podiatry

## 2022-10-19 ENCOUNTER — Ambulatory Visit
Admission: RE | Admit: 2022-10-19 | Discharge: 2022-10-19 | Disposition: A | Discharge status: Home / Self Care | Payer: BC Managed Care – PPO | Source: Ambulatory Visit

## 2022-10-19 ENCOUNTER — Ambulatory Visit (INDEPENDENT_AMBULATORY_CARE_PROVIDER_SITE_OTHER): Payer: BC Managed Care – PPO

## 2022-10-19 VITALS — BP 126/80 | HR 111 | Temp 98.1°F | Resp 16

## 2022-10-19 DIAGNOSIS — N941 Unspecified dyspareunia: Secondary | ICD-10-CM | POA: Diagnosis present

## 2022-10-19 DIAGNOSIS — R1031 Right lower quadrant pain: Secondary | ICD-10-CM | POA: Insufficient documentation

## 2022-10-19 LAB — POCT URINALYSIS DIP (MANUAL ENTRY)
Bilirubin, UA: NEGATIVE
Blood, UA: NEGATIVE
Glucose, UA: NEGATIVE mg/dL
Ketones, POC UA: NEGATIVE mg/dL
Leukocytes, UA: NEGATIVE
Nitrite, UA: NEGATIVE
Spec Grav, UA: >=1.03 — AB (ref 1.010–1.025)
Urobilinogen, UA: 0.2 E.U./dL
pH, UA: 6 (ref 5.0–8.0)

## 2022-10-19 LAB — POCT URINE PREGNANCY: Preg Test, Ur: NEGATIVE

## 2022-10-19 NOTE — ED Provider Notes (Signed)
[facility]    CSN: [id number] Arrival date & time: 10/19/22  8469      History   Chief Complaint Chief Complaint  Patient presents with   Abdominal Pain    Entered by patient    HPI Cynthia Joyce is a 22 y.o. female.   The history is provided by the patient.   The patient presents with a 1 to 2-week history of right lower quadrant abdominal pain.  Patient states that pain is present with sexual intercourse, and when she presses on the right side of her lower abdomen.  The patient denies urinary symptoms, vaginal discharge, vaginal odor, or vaginal itching.  Patient states that she currently has an IUD, and has had abnormal bleeding, but this is nothing new.  She states that she had her Pap smear last in January, with no abnormality seen.  Patient reports that her last bowel movement was 1 day ago.  She denies history of constipation.  Patient states that this seen occurrence happened prior, states that it was gas, she states that she took medication for that and it went away.  She states that she took medication for the gas this time, but has not noticed any improvement.  Past Medical History:  Diagnosis Date   Seasonal allergies     There are no problems to display for this patient.   History reviewed. No pertinent surgical history.  OB History   No obstetric history on file.      Home Medications    Prior to Admission medications   Medication Sig Start Date End Date Taking? Authorizing Provider  buPROPion (WELLBUTRIN XL) 300 MG 24 hr tablet Take 300 mg by mouth. 05/15/22 05/15/23 Yes [provider]  escitalopram (LEXAPRO) 10 MG tablet Take 5 mg by mouth. 04/11/21  Yes [provider]  acetaminophen (TYLENOL) 325 MG tablet Take 650 mg by mouth every 6 (six) hours as needed.    [provider]  amoxicillin (AMOXIL) 875 MG tablet Take 1 tablet (875 mg total) by mouth 2 (two) times daily. 12/06/20   Bing Neighbors, NP  BLISOVI  FE 1/20 1-20 MG-MCG tablet Take 1 tablet by mouth daily. 09/07/20   [provider]  doxycycline (VIBRA-TABS) 100 MG tablet Take 1 tablet (100 mg total) by mouth 2 (two) times daily. 03/27/21   Felecia Shelling, DPM  EYE ALLERGY ITCH RELIEF 0.2 % SOLN INSTILL 1 DROP IN BOTH EYES EVERY DAY 08/11/20   [provider]  gentamicin cream (GARAMYCIN) 0.1 % Apply 1 application topically 2 (two) times daily. 03/27/21   Felecia Shelling, DPM  ibuprofen (ADVIL) 200 MG tablet Take 400 mg by mouth every 6 (six) hours as needed for headache or moderate pain.    [provider]  levonorgestrel (KYLEENA) 19.5 MG IUD 1 each by Intrauterine route See admin instructions.    [provider]  FLUoxetine (PROZAC) 10 MG tablet Take 1 tablet (10 mg total) by mouth daily. Patient not taking: Reported on 01/27/2020 03/02/18 08/15/20  Gerhard Munch, MD    Family History History reviewed. No pertinent family history.  Social History Social History   Tobacco Use   Smoking status: Never   Smokeless tobacco: Never  Substance Use Topics   Alcohol use: Never   Drug use: Never     Allergies   Penicillins   Review of Systems Review of Systems Per HPI  Physical Exam Triage Vital Signs ED Triage Vitals  Enc Vitals Group  BP 10/19/22 0925 126/80     Pulse Rate 10/19/22 0925 (!) 111     Resp 10/19/22 0925 16     Temp 10/19/22 0925 98.1 F (36.7 C)     Temp Source 10/19/22 0925 Oral     SpO2 10/19/22 0925 99 %     Weight --      Height --      Head Circumference --      Peak Flow --      Pain Score 10/19/22 0928 5     Pain Loc --      Pain Edu? --      Excl. in GC? --    No data found.  Updated Vital Signs BP 126/80 (BP Location: Right Arm)   Pulse (!) 111   Temp 98.1 F (36.7 C) (Oral)   Resp 16   LMP  (LMP Unknown) Comment: pt does not have menstrual periods from IUD  SpO2 99%   Visual Acuity Right Eye Distance:   Left Eye Distance:   Bilateral  Distance:    Right Eye Near:   Left Eye Near:    Bilateral Near:     Physical Exam Vitals and nursing note reviewed.  Constitutional:      General: She is not in acute distress.    Appearance: She is well-developed.  HENT:     Head: Normocephalic.  Cardiovascular:     Rate and Rhythm: Regular rhythm.     Heart sounds: Normal heart sounds.  Pulmonary:     Effort: Pulmonary effort is normal.     Breath sounds: Normal breath sounds.  Abdominal:     General: Abdomen is flat. Bowel sounds are normal.     Tenderness: There is abdominal tenderness.  Skin:    General: Skin is warm and dry.  Neurological:     General: No focal deficit present.     Mental Status: She is alert.  Psychiatric:        Mood and Affect: Mood normal.        Behavior: Behavior normal.      UC Treatments / Results  Labs (all labs ordered are listed, but only abnormal results are displayed) Labs Reviewed  POCT URINALYSIS DIP (MANUAL ENTRY) - Abnormal; Notable for the following components:      Result Value   Clarity, UA hazy (*)    Spec Grav, UA >=1.030 (*)    Protein Ur, POC trace (*)    All other components within normal limits  URINE CULTURE  POCT URINE PREGNANCY  CERVICOVAGINAL ANCILLARY ONLY    EKG   Radiology DG Abd 1 View  Result Date: 10/19/2022 CLINICAL DATA:  lower abdominal pain x 2 weeks EXAM: ABDOMEN - 1 VIEW COMPARISON:  None Available. FINDINGS: Nonspecific bowel gas pattern with gas in distal colon and paucity of gas in small bowel. No dilated loops identified. No abnormal calcifications identified. Lung bases are clear. No acute osseous abnormality. IUD projects over the lower anatomic pelvis. IMPRESSION: Nonspecific bowel gas pattern with gas in distal colon and paucity of gas in small bowel. No dilated loops identified. Electronically Signed   By: Feliberto Harts M.D.   On: 10/19/2022 10:28    Procedures Procedures (including critical care time)  Medications Ordered in  UC Medications - No data to display  Initial Impression / Assessment and Plan / UC Course  I have reviewed the triage vital signs and the nursing notes.  Pertinent labs & imaging results that  were available during my care of the patient were reviewed by me and considered in my medical decision making (see chart for details).  The patient is well-appearing, she is in no acute distress, vital signs are stable.  Urinalysis is negative for obvious urinary tract infection.  Urine culture is pending.  Urine pregnancy test is also negative.  X-ray of the abdomen does show nonspecific bowel gas pattern with paucity of gas in the small bowel, consistent with location of where patient is having abdominal tenderness.  Cytology swab is pending.  Symptoms do not appear to be consistent with acute abdomen, also do not suspect CMT.  Will have patient continue over-the-counter medications for gas.  Patient was advised that if symptoms do not improve, will have her follow-up with her primary care physician.  If symptoms worsen to include fever, chills, worsening abdominal pain, vomiting, diarrhea, or other concerns, will have patient go to the emergency department for further evaluation.  Patient is in agreement with this plan of care and verbalizes understanding.  All questions were answered.  Patient stable for discharge.  Final Clinical Impressions(s) / UC Diagnoses   Final diagnoses:  Dyspareunia in female  RLQ abdominal pain     Discharge Instructions      The x-ray of the abdomen does show gas in the area in which you are having pain.  The urinalysis is negative for urinary tract infection, the urine pregnancy test is negative.  A urine culture and cytology swab are pending.  If the pending test results are positive, you will be contacted. May take over-the-counter Tylenol as needed for pain, fever, or general discomfort. Increase fluids and allow for plenty of rest. Recommend remaining active to help  relieve gas. May take medication such as Gas-X or simethicone to help relieve symptoms. If you develop worsening symptoms to include fever, chills, worsening abdominal pain, or other concerns, please go to the emergency department immediately.  If your symptoms do not improve, please follow-up with your primary care physician. Follow-up as needed.       ED Prescriptions   None    PDMP not reviewed this encounter.   Abran Cantor, NP 10/19/22 1041

## 2022-10-19 NOTE — Discharge Instructions (Signed)
The x-ray of the abdomen does show gas in the area in which you are having pain.  The urinalysis is negative for urinary tract infection, the urine pregnancy test is negative.  A urine culture and cytology swab are pending.  If the pending test results are positive, you will be contacted. May take over-the-counter Tylenol as needed for pain, fever, or general discomfort. Increase fluids and allow for plenty of rest. Recommend remaining active to help relieve gas. May take medication such as Gas-X or simethicone to help relieve symptoms. If you develop worsening symptoms to include fever, chills, worsening abdominal pain, or other concerns, please go to the emergency department immediately.  If your symptoms do not improve, please follow-up with your primary care physician. Follow-up as needed.

## 2022-10-19 NOTE — ED Triage Notes (Signed)
Pt c/o abdominal pain x 1 week, pt states she was having pain during intercourse with partner and noticed the pain and then started checking where it would hurt during intercourse states it hurts to apply pressure there

## 2022-10-20 LAB — URINE CULTURE

## 2022-10-24 LAB — CERVICOVAGINAL ANCILLARY ONLY
Bacterial Vaginitis (gardnerella): NEGATIVE
Candida Glabrata: NEGATIVE
Candida Vaginitis: NEGATIVE
Chlamydia: NEGATIVE
Comment: NEGATIVE
Comment: NEGATIVE
Comment: NEGATIVE
Comment: NEGATIVE
Comment: NEGATIVE
Comment: NORMAL
Neisseria Gonorrhea: NEGATIVE
Trichomonas: NEGATIVE

## 2022-11-16 ENCOUNTER — Ambulatory Visit (INDEPENDENT_AMBULATORY_CARE_PROVIDER_SITE_OTHER): Payer: BC Managed Care – PPO | Admitting: Podiatry

## 2022-11-16 DIAGNOSIS — L600 Ingrowing nail: Secondary | ICD-10-CM

## 2022-11-16 DIAGNOSIS — L03031 Cellulitis of right toe: Secondary | ICD-10-CM

## 2022-11-16 MED ORDER — CLINDAMYCIN HCL 300 MG PO CAPS: 300.0000 mg | ORAL_CAPSULE | Freq: Three times a day (TID) | ORAL | 0 refills | Status: AC

## 2022-11-16 NOTE — Patient Instructions (Signed)

## 2022-11-16 NOTE — Progress Notes (Signed)
  Subjective:  Patient ID: Cynthia Joyce, female    DOB: [birth year],  MRN: [id number]  Chief Complaint  Patient presents with   Nail Problem    Toenail detached left greater toe. Painful at times. Patient sister stepped on her nail and nail detached from nail bed.    Ingrown Toenail    Ingrown on greater toe on the right, lateral border. Pus noted to the border. She is not taking antibiotics.     22 y.o. female presents right hallux lateral border ingrown. Has had some puss draining form the area. Also with concern for left hallux ingrown due to trauma.   Past Medical History:  Diagnosis Date   Seasonal allergies     Allergies  Allergen Reactions   Penicillins Rash    ROS: Negative except as per HPI above  Objective:  General: AAO x3, NAD  Dermatological: Incurvation is present along the lateral nail border of the right great toe. There is localized edema with erythema and abscess around the nail border. There is no ascending cellulitis. No malodor. No open lesions or pre-ulcerative lesions. Left hallux with pain and nail is partially lysed off the nail bed pain around the medial border  Vascular:  Dorsalis Pedis artery and Posterior Tibial artery pedal pulses are 2/4 bilateral.  Capillary fill time < 3 sec to all digits.   Neruologic: Grossly intact via light touch bilateral. Protective threshold intact to all sites bilateral.   Musculoskeletal: No gross boney pedal deformities bilateral. No pain, crepitus, or limitation noted with foot and ankle range of motion bilateral. Muscular strength 5/5 in all groups tested bilateral.  Gait: Unassisted, Nonantalgic.   No images are attached to the encounter.   Assessment:   1. Ingrown nail of great toe of right foot   2. Ingrown nail of great toe of left foot      Plan:  Patient was evaluated and treated and all questions answered.    Ingrown Nail, right and left -Patient elects to proceed with minor surgery to remove  ingrown toenail today. Consent reviewed and signed by patient. -Ingrown nail excised. See procedure note. -Educated on post-procedure care including soaking. Written instructions provided and reviewed. -Patient to follow up in 2 weeks for nail check. -Erx for clindamycin 300 mg TID for 5 days  Procedure: Excision of Ingrown Toenail Location: Right 1st toe lateral nail borders with phenol. Left hallux total nail avulsion - NO PHENOL on left Anesthesia: Lidocaine 1% plain; 1.5 mL and Marcaine 0.5% plain; 1.5 mL, digital block. Skin Prep: Betadine. Dressing: Silvadene; telfa; dry, sterile, compression dressing. Technique: Following skin prep, the toe was exsanguinated and a tourniquet was secured at the base of the toe. The affected nail border was freed, split with a nail splitter, and excised. Chemical matrixectomy was then performed with phenol and irrigated out with alcohol. The tourniquet was then removed and sterile dressing applied. Disposition: Patient tolerated procedure well. Patient to return in 2 weeks for follow-up.    No follow-ups on file.          Corinna Gab, DPM [facility] / [facility]

## 2022-11-30 ENCOUNTER — Ambulatory Visit: Payer: BC Managed Care – PPO | Admitting: Podiatry

## 2023-06-12 ENCOUNTER — Ambulatory Visit
Admission: EM | Admit: 2023-06-12 | Discharge: 2023-06-12 | Disposition: A | Discharge status: Home / Self Care | Payer: BC Managed Care – PPO | Attending: Family Medicine | Admitting: Family Medicine

## 2023-06-12 DIAGNOSIS — R197 Diarrhea, unspecified: Secondary | ICD-10-CM

## 2023-06-12 DIAGNOSIS — R112 Nausea with vomiting, unspecified: Secondary | ICD-10-CM | POA: Diagnosis not present

## 2023-06-12 LAB — POC COVID19/FLU A&B COMBO
Covid Antigen, POC: NEGATIVE
Influenza A Antigen, POC: NEGATIVE
Influenza B Antigen, POC: NEGATIVE

## 2023-06-12 MED ORDER — ONDANSETRON 8 MG PO TBDP
8.0000 mg | ORAL_TABLET | Freq: Once | ORAL | Status: AC
  Administered 2023-06-12: 8 mg via ORAL

## 2023-06-12 MED ORDER — ONDANSETRON 4 MG PO TBDP: 4.0000 mg | ORAL_TABLET | Freq: Three times a day (TID) | ORAL | 0 refills | Status: DC | PRN

## 2023-06-12 NOTE — ED Triage Notes (Addendum)
Pt states N/V/D since last night with lower abdominal pain.

## 2023-06-16 NOTE — ED Provider Notes (Signed)
[facility]    CSN: [id number] Arrival date & time: 06/12/23  1756      History   Chief Complaint Chief Complaint  Patient presents with   Emesis    HPI Cynthia Joyce is a 23 y.o. female.   Presenting today with 1 day history of N/V/D and lower abdominal pain. Denies fever, chills, body aches, URI sxs, urinary sxs, new medications, new foods, recent travel, concern for pregnancy (IUD in place). So far trying OTC nausea remedies with minimal relief.     Past Medical History:  Diagnosis Date   Seasonal allergies     There are no active problems to display for this patient.   History reviewed. No pertinent surgical history.  OB History   No obstetric history on file.      Home Medications    Prior to Admission medications   Medication Sig Start Date End Date Taking? Authorizing Provider  ondansetron (ZOFRAN-ODT) 4 MG disintegrating tablet Take 1 tablet (4 mg total) by mouth every 8 (eight) hours as needed for nausea or vomiting. 06/12/23  Yes Particia Nearing, PA-C  acetaminophen (TYLENOL) 325 MG tablet Take 650 mg by mouth every 6 (six) hours as needed.    [provider]  amoxicillin (AMOXIL) 875 MG tablet Take 1 tablet (875 mg total) by mouth 2 (two) times daily. 12/06/20   Bing Neighbors, NP  BLISOVI FE 1/20 1-20 MG-MCG tablet Take 1 tablet by mouth daily. 09/07/20   [provider]  buPROPion (WELLBUTRIN XL) 300 MG 24 hr tablet Take 300 mg by mouth. 05/15/22 05/15/23  [provider]  doxycycline (VIBRA-TABS) 100 MG tablet Take 1 tablet (100 mg total) by mouth 2 (two) times daily. 03/27/21   Felecia Shelling, DPM  escitalopram (LEXAPRO) 10 MG tablet Take 5 mg by mouth. 04/11/21   [provider]  EYE ALLERGY ITCH RELIEF 0.2 % SOLN INSTILL 1 DROP IN BOTH EYES EVERY DAY 08/11/20   [provider]  gentamicin cream (GARAMYCIN) 0.1 % Apply 1 application topically 2 (two) times daily. 03/27/21   Felecia Shelling, DPM  ibuprofen (ADVIL) 200 MG tablet Take 400 mg by mouth every 6 (six) hours as needed for headache or moderate pain.    [provider]  levonorgestrel (KYLEENA) 19.5 MG IUD 1 each by Intrauterine route See admin instructions.    [provider]  FLUoxetine (PROZAC) 10 MG tablet Take 1 tablet (10 mg total) by mouth daily. Patient not taking: Reported on 01/27/2020 03/02/18 08/15/20  Gerhard Munch, MD    Family History History reviewed. No pertinent family history.  Social History Social History   Tobacco Use   Smoking status: Never   Smokeless tobacco: Never  Substance Use Topics   Alcohol use: Never   Drug use: Never     Allergies   Penicillins   Review of Systems Review of Systems PER HPI  Physical Exam Triage Vital Signs ED Triage Vitals  Encounter Vitals Group     BP 06/12/23 1908 115/83     Systolic BP Percentile --      Diastolic BP Percentile --      Pulse Rate 06/12/23 1908 (!) 108     Resp 06/12/23 1908 16     Temp 06/12/23 1908 98.6 F (37 C)     Temp Source 06/12/23 1908 Oral     SpO2 06/12/23 1908 97 %     Weight --      Height --  Head Circumference --      Peak Flow --      Pain Score 06/12/23 1910 6     Pain Loc --      Pain Education --      Exclude from Growth Chart --    No data found.  Updated Vital Signs BP 115/83 (BP Location: Right Arm)   Pulse (!) 108   Temp 98.6 F (37 C) (Oral)   Resp 16   LMP  (LMP Unknown)   SpO2 97%   Visual Acuity Right Eye Distance:   Left Eye Distance:   Bilateral Distance:    Right Eye Near:   Left Eye Near:    Bilateral Near:     Physical Exam Vitals and nursing note reviewed.  Constitutional:      Appearance: Normal appearance. She is not ill-appearing.  HENT:     Head: Atraumatic.  Eyes:     Extraocular Movements: Extraocular movements intact.     Conjunctiva/sclera: Conjunctivae normal.  Cardiovascular:     Rate and Rhythm: Normal rate and regular rhythm.      Heart sounds: Normal heart sounds.  Pulmonary:     Effort: Pulmonary effort is normal.     Breath sounds: Normal breath sounds.  Abdominal:     General: Bowel sounds are normal. There is no distension.     Palpations: Abdomen is soft.     Tenderness: There is no abdominal tenderness. There is no guarding.  Musculoskeletal:        General: Normal range of motion.     Cervical back: Normal range of motion and neck supple.  Skin:    General: Skin is warm and dry.  Neurological:     Mental Status: She is alert and oriented to person, place, and time.  Psychiatric:        Mood and Affect: Mood normal.        Thought Content: Thought content normal.        Judgment: Judgment normal.      UC Treatments / Results  Labs (all labs ordered are listed, but only abnormal results are displayed) Labs Reviewed  POC COVID19/FLU A&B COMBO    EKG   Radiology No results found.  Procedures Procedures (including critical care time)  Medications Ordered in UC Medications  ondansetron (ZOFRAN-ODT) disintegrating tablet 8 mg (8 mg Oral Given 06/12/23 1943)    Initial Impression / Assessment and Plan / UC Course  I have reviewed the triage vital signs and the nursing notes.  Pertinent labs & imaging results that were available during my care of the patient were reviewed by me and considered in my medical decision making (see chart for details).     Vitals and exam reassuring, no red flag findings. Suspect viral GI illness. COVID and flu testing neg. Treat with zofran, brat diet, fluids, rest. Return precautions reviewed.   Final Clinical Impressions(s) / UC Diagnoses   Final diagnoses:  Nausea vomiting and diarrhea   Discharge Instructions   None    ED Prescriptions     Medication Sig Dispense Auth. Provider   ondansetron (ZOFRAN-ODT) 4 MG disintegrating tablet Take 1 tablet (4 mg total) by mouth every 8 (eight) hours as needed for nausea or vomiting. 20 tablet Particia Nearing, [place]      PDMP not reviewed this encounter.   Particia Nearing, [place] 06/16/23 2216

## 2023-11-21 ENCOUNTER — Ambulatory Visit
Admission: RE | Admit: 2023-11-21 | Discharge: 2023-11-21 | Disposition: A | Discharge status: Home / Self Care | Source: Ambulatory Visit | Attending: Family Medicine | Admitting: Family Medicine

## 2023-11-21 VITALS — BP 134/89 | HR 89 | Temp 99.4°F | Resp 14

## 2023-11-21 DIAGNOSIS — R202 Paresthesia of skin: Secondary | ICD-10-CM | POA: Diagnosis not present

## 2023-11-21 LAB — POCT FASTING CBG KUC MANUAL ENTRY: POCT Glucose (KUC): 98 mg/dL (ref 70–99)

## 2023-11-21 NOTE — ED Provider Notes (Signed)
 [facility]  Note:  This document was prepared using Conservation officer, historic buildings and may include unintentional dictation errors.  MRN: [id number] DOB: 11-06-2000  Subjective:   Cynthia Joyce is a 23 y.o. female presenting for acute onset since this morning of left thumb numbness and 2-hour history of left leg dullness.  No fever, vision change, headache, weakness, rashes, injury, trauma, fall, wounds, back pain, changes to bowel or urinary habits, speech difficulty.  Patient awoke with the symptoms for her thumb.  No history of neurologic or musculoskeletal conditions.  No drug use.  No alcohol use.  Patient does not take any medications apart from having an IUD.  Admits that she could hydrate better and eat more regularly.  Set up an appointment with her PCP for tomorrow, declines blood work in clinic today.  No current facility-administered medications for this encounter.  Current Outpatient Medications:    levonorgestrel (KYLEENA) 19.5 MG IUD, 1 each by Intrauterine route See admin instructions., Disp: , Rfl:    Allergies  Allergen Reactions   Penicillins Rash    Past Medical History:  Diagnosis Date   Seasonal allergies      History reviewed. No pertinent surgical history.  Family History  Problem Relation Age of Onset   Healthy Mother    Healthy Father     Social History   Tobacco Use   Smoking status: Never   Smokeless tobacco: Never  Vaping Use   Vaping status: Never Used  Substance Use Topics   Alcohol use: Never   Drug use: Never    ROS   Objective:   Vitals: BP 134/89 (BP Location: Left Arm)   Pulse 89   Temp 99.4 F (37.4 C) (Oral)   Resp 14   SpO2 99%   Physical Exam Constitutional:      General: She is not in acute distress.    Appearance: Normal appearance. She is well-developed. She is not ill-appearing, toxic-appearing or diaphoretic.  HENT:     Head: Normocephalic and atraumatic.     Right Ear:  External ear normal.     Left Ear: External ear normal.     Nose: Nose normal.     Mouth/Throat:     Mouth: Mucous membranes are moist.  Eyes:     General: No scleral icterus.       Right eye: No discharge.        Left eye: No discharge.     Extraocular Movements: Extraocular movements intact.  Neck:     Meningeal: Brudzinski's sign and Kernig's sign absent.  Cardiovascular:     Rate and Rhythm: Normal rate.  Pulmonary:     Effort: Pulmonary effort is normal.  Musculoskeletal:        General: No swelling, tenderness, deformity or signs of injury. Normal range of motion.  Skin:    General: Skin is warm and dry.     Findings: No rash.  Neurological:     General: No focal deficit present.     Mental Status: She is alert and oriented to person, place, and time.     Cranial Nerves: No cranial nerve deficit, dysarthria or facial asymmetry.     Sensory: No sensory deficit (sensation to touch, sharp, soft intact throughout the upper extremities bilaterally, symmetric).     Motor: No weakness or pronator drift.     Coordination: Romberg sign negative. Coordination normal. Finger-Nose-Finger Test and Heel to [facility] Test normal. Rapid alternating movements normal.  Gait: Gait and tandem walk normal.     Deep Tendon Reflexes: Reflexes normal.  Psychiatric:        Mood and Affect: Mood normal.        Behavior: Behavior normal.        Thought Content: Thought content normal.        Judgment: Judgment normal.     Results for orders placed or performed during the hospital encounter of 11/21/23 (from the past 24 hours)  POCT CBG (manual entry)     Status: Normal   Collection Time: 11/21/23  3:43 PM  Result Value Ref Range   POCT Glucose ([facility]) 98 70 - 99 mg/dL    Assessment and Plan :   PDMP not reviewed this encounter.  1. Paresthesia    Undifferentiated neurologic symptoms. Recommended conservative measures, health maintenance.  Keep follow-up appointment with PCP tomorrow.  Counseled patient on potential for adverse effects with medications prescribed/recommended today, ER and return-to-clinic precautions discussed, patient verbalized understanding.    Cynthia Joyce, [place] 11/21/23 8442

## 2023-11-21 NOTE — ED Triage Notes (Signed)
 Pt reports she woke up today with numbness in left thumb, left left started feeling numb around  1300 today and feel odd.
